# Patient Record
Sex: Female | Born: 1995 | Race: Black or African American | Hispanic: No | Marital: Single | Smoking: Current every day smoker
Health system: Southern US, Community
[De-identification: ages and names within clinical notes are randomized; demographics above are authoritative.]

---
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
## 2016-08-20 IMAGING — US US BREAST*L* LIMITED INC AXILLA
1 series · 13 of 13 positions shown · non-contrast
Comparison: None.

CLINICAL DATA: 19-year-old female with recent history of a left
nipple piercing that became infected. The patient presents with a
palpable left subareolar mass associated with pain and erythema.

EXAM:
ULTRASOUND OF THE LEFT BREAST

[Series 1: us breast*left* limited inc axilla · 0.10mm/px · 13 of 13 slices shown]
[im 1/13]
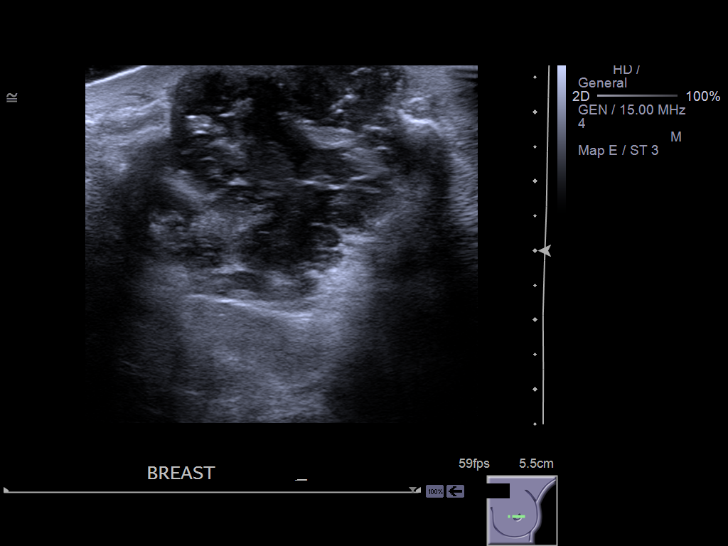
[im 2/13]
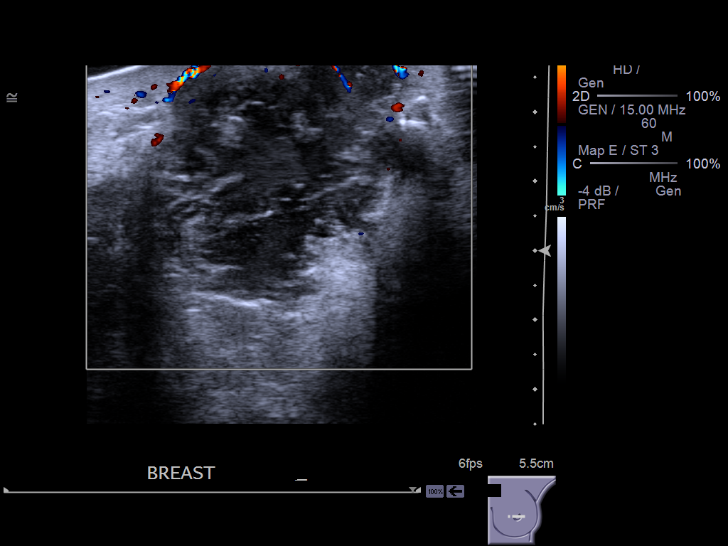
[im 3/13]
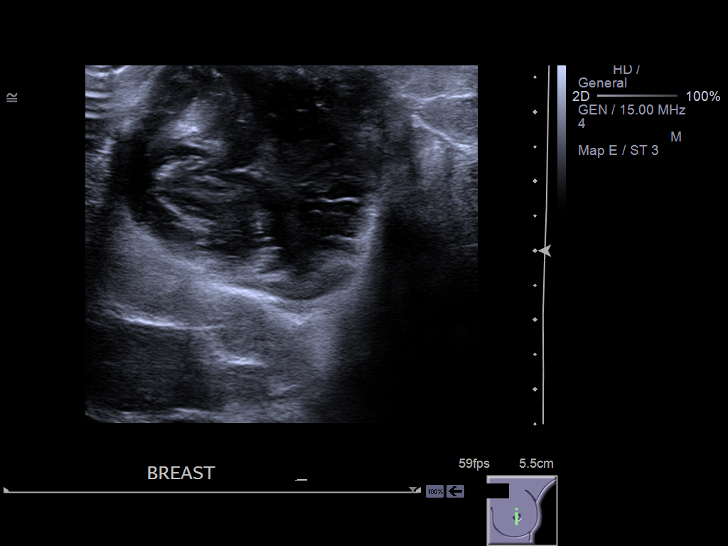
[im 4/13]
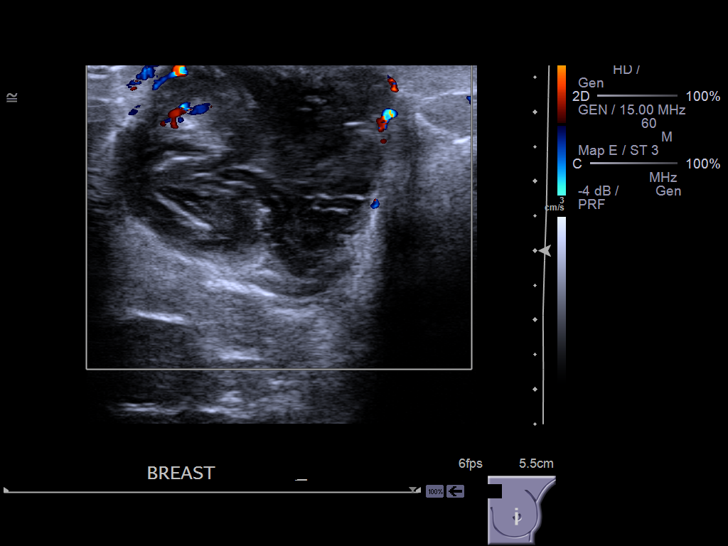
[im 5/13]
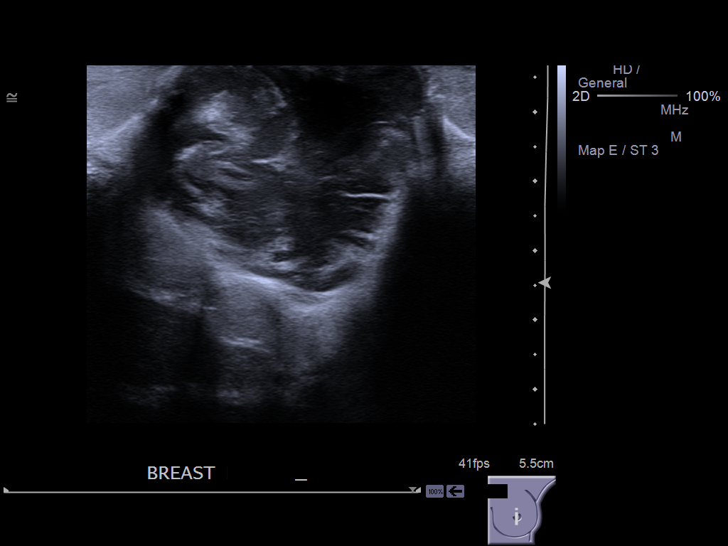
[im 6/13]
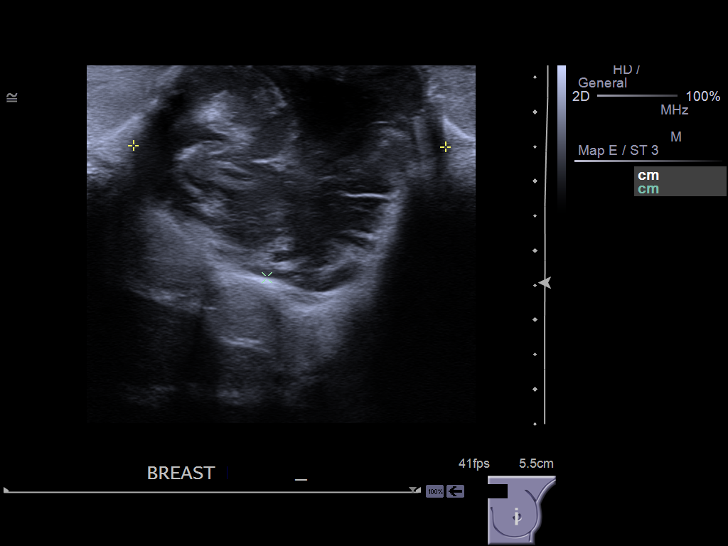
[im 7/13]
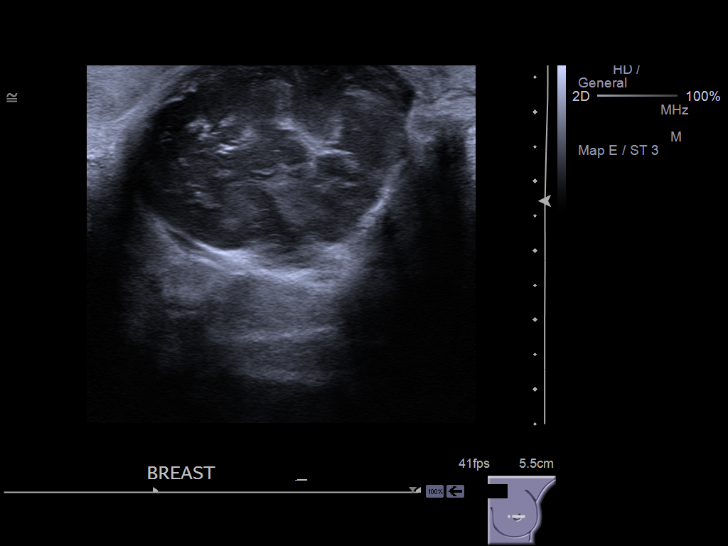
[im 8/13]
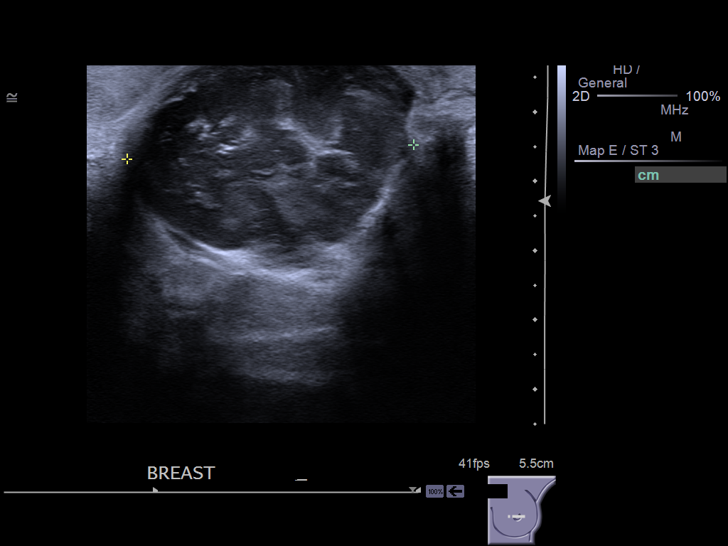
[im 9/13]
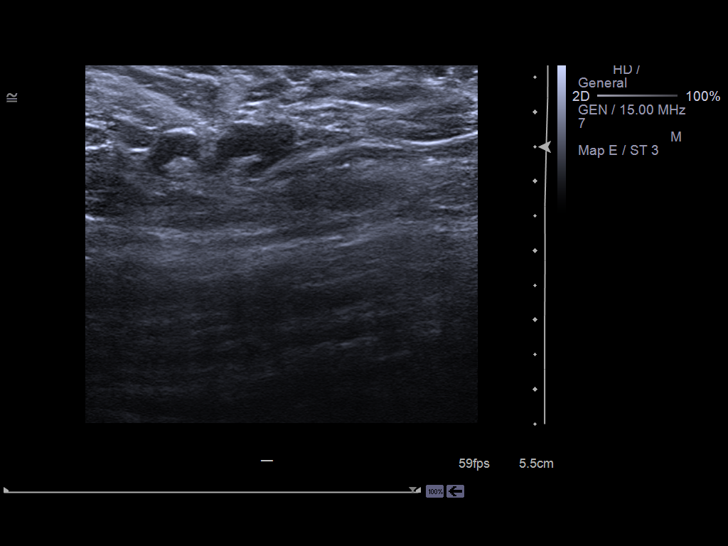
[im 10/13]
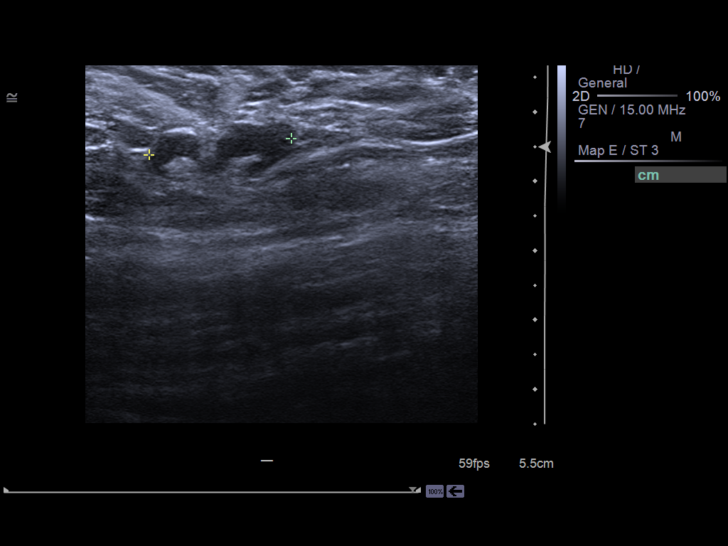
[im 11/13]
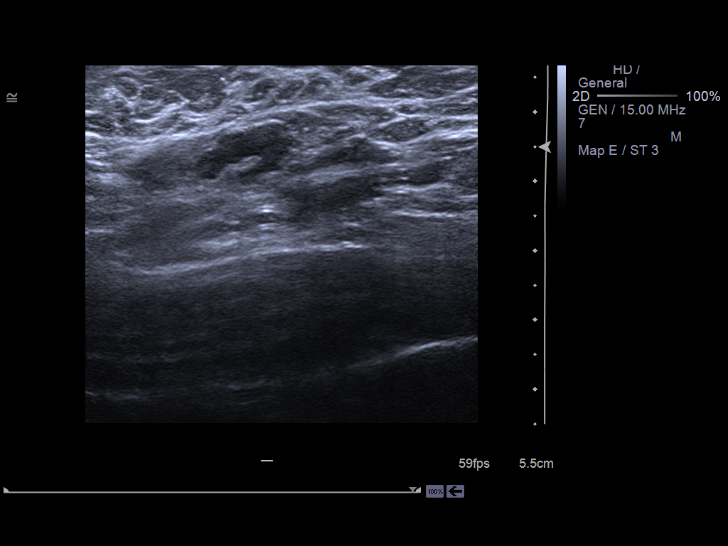
[im 12/13]
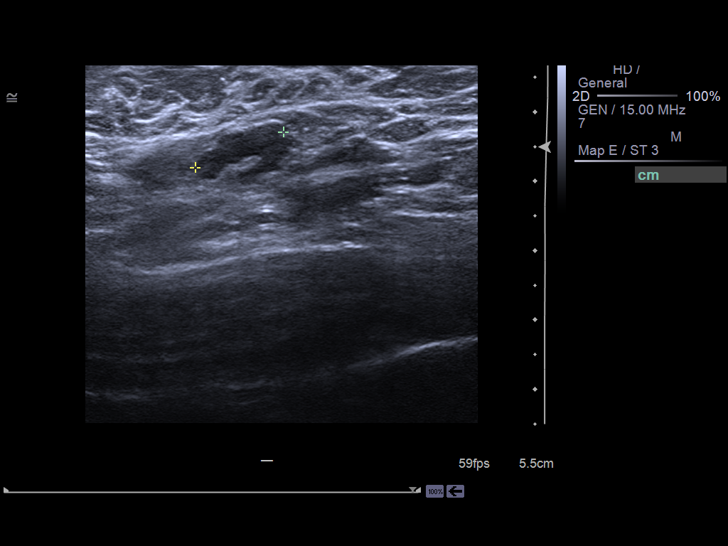
[im 13/13]
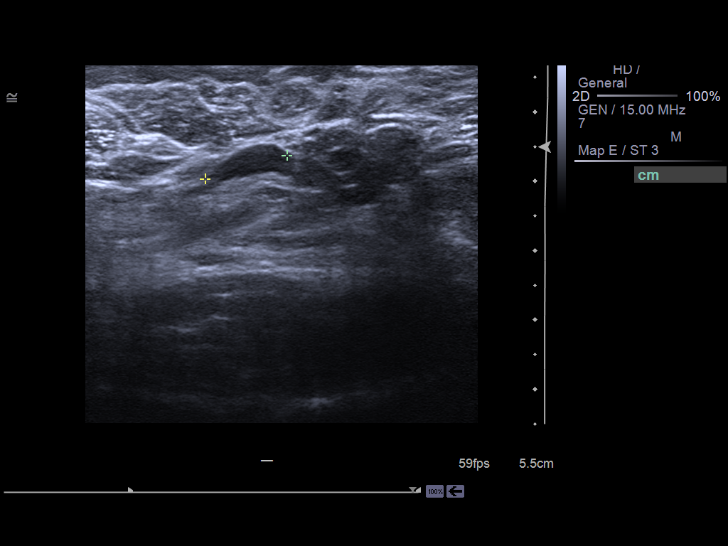

[13 of 13 positions shown; findings below may reference images not displayed]

FINDINGS: On physical exam, there is visible erythema surrounding the nipple
and areola associated with a palpable mass.

Targeted ultrasound is performed, showing a complex mixed echogenic
mass measuring 4.5 x 3.2 x 4.1 cm. Sonographic evaluation of the
left axilla shows 2 lymph nodes measuring 1.4 and 1.2 cm with
thickened cortices.
IMPRESSION: Probable left subareolar abscess and inflammatory axillary
adenopathy.

RECOMMENDATION:
Ultrasound-guided aspiration will be performed and dictated
separately. The fluid will be sent for culture and gram stain. The
patient's physician Dr. Seinde called the patient in a
prescription for Augmentin. She will have a follow-up clinical exam
and ultrasound in 2 weeks. She was told if the lesion became bigger
on antibiotics she should return for sooner followup.

I have discussed the findings and recommendations with the patient.
Results were also provided in writing at the conclusion of the
visit. If applicable, a reminder letter will be sent to the patient
regarding the next appointment.

BI-RADS CATEGORY  3: Probably benign finding(s) - short interval
follow-up suggested.

## 2016-08-20 IMAGING — US US BREAST CYST ASPIRATION 1ST CYST
1 series · 4 of 4 positions shown · non-contrast
Comparison: Previous exams.

CLINICAL DATA: 19-year-old female with recent history of a nipple
piercing that became infected. She presents with a left subareolar
mass associated with pain in erythema.

EXAM:
ULTRASOUND GUIDED LEFT BREAST CYST ASPIRATION

[Series 1: us breast cyst aspiration 1st cyst · 0.08mm/px · 4 of 4 slices shown]
[im 1/4]
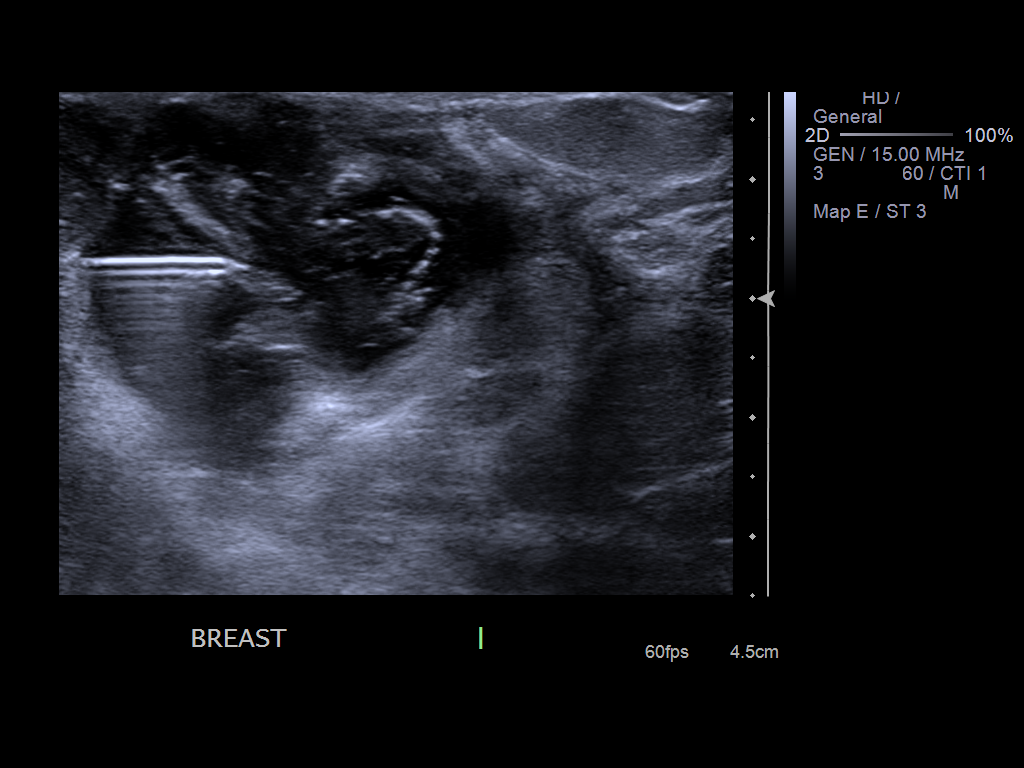
[im 2/4]
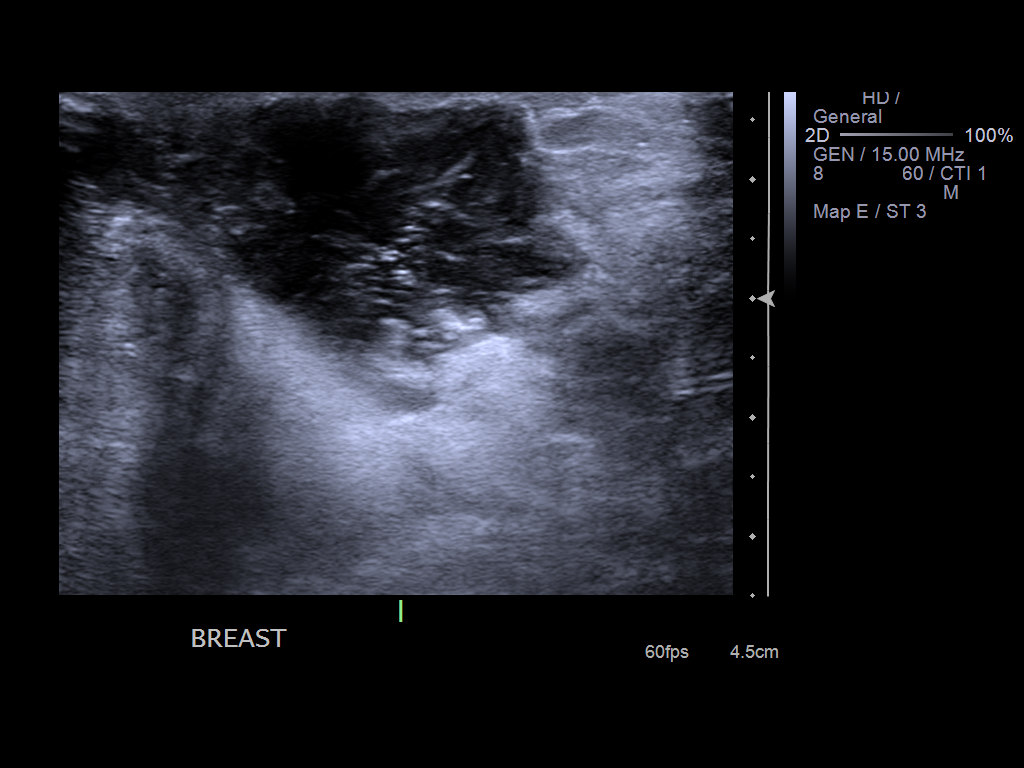
[im 3/4]
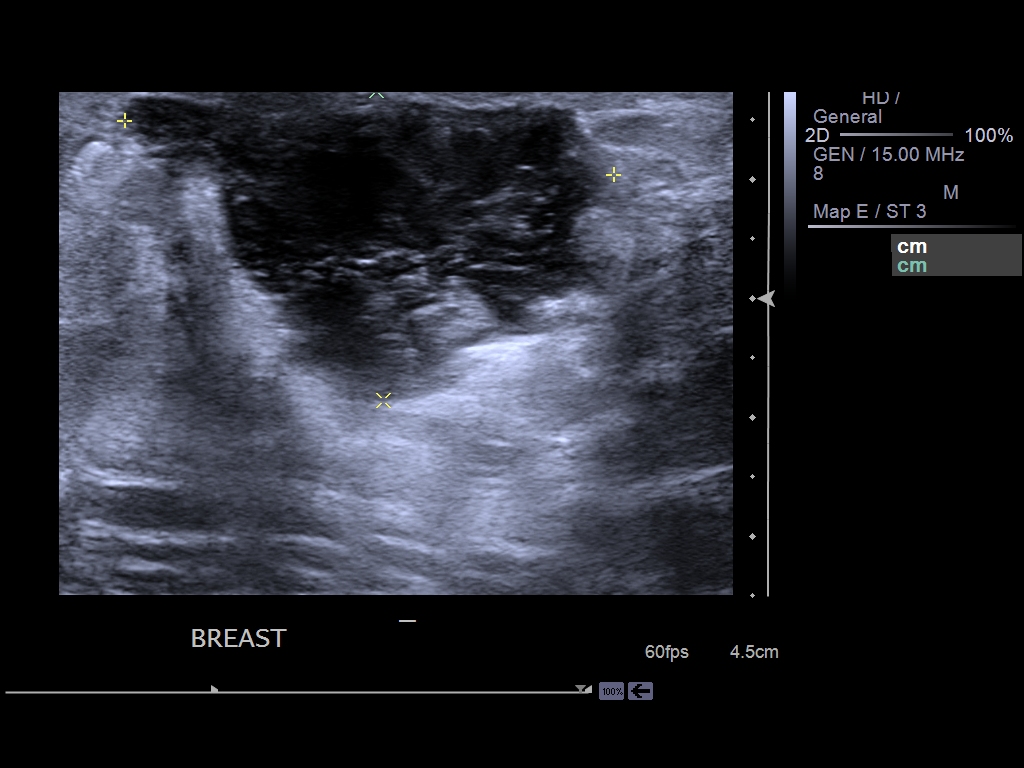
[im 4/4]
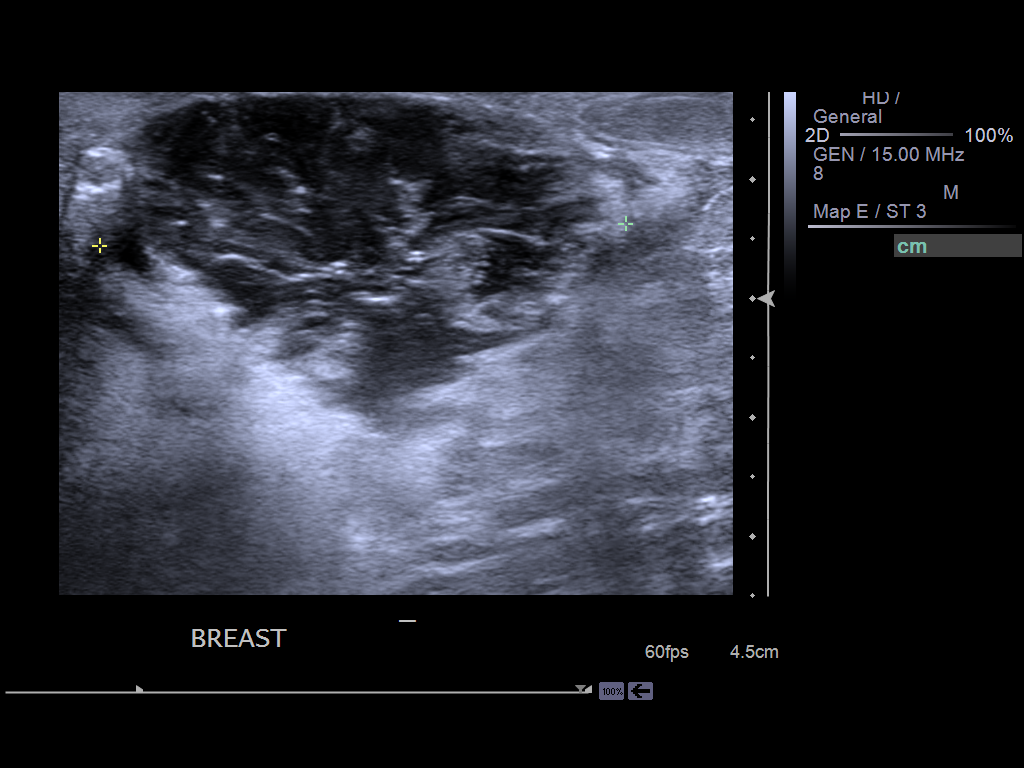

[4 of 4 positions shown; findings below may reference images not displayed]

PROCEDURE:
Using sterile technique, 2% lidocaine, under direct ultrasound
visualization, needle aspiration of the left subareolar mass was
performed. Approximately 10 cc of purulent brown fluid was
aspirated. Following the aspiration a hypoechoic mass measuring
x 2.6 x 4.4 cm persisted.
IMPRESSION: Ultrasound-guided aspiration of the left breast No apparent
complications.

RECOMMENDATIONS:
Probable left breast abscess. The fluid will be sent for culture and
gram stain. The patient's physician Dr. [REDACTED] the patient
on Augmentin. She will have a follow-up clinical exam and ultrasound
in 2 weeks. She was told if the lesion became bigger on antibiotics
she should return for sooner followup.

## 2017-06-10 ENCOUNTER — Encounter: Payer: Self-pay | Admitting: Emergency Medicine

## 2017-06-10 ENCOUNTER — Emergency Department
Admission: EM | Admit: 2017-06-10 | Discharge: 2017-06-10 | Disposition: A | Discharge status: Home / Self Care | Payer: Self-pay | Attending: Emergency Medicine | Admitting: Emergency Medicine

## 2017-06-10 ENCOUNTER — Other Ambulatory Visit: Payer: Self-pay

## 2017-06-10 DIAGNOSIS — F17200 Nicotine dependence, unspecified, uncomplicated: Secondary | ICD-10-CM | POA: Insufficient documentation

## 2017-06-10 DIAGNOSIS — Y9259 Other trade areas as the place of occurrence of the external cause: Secondary | ICD-10-CM | POA: Insufficient documentation

## 2017-06-10 DIAGNOSIS — S025XXA Fracture of tooth (traumatic), initial encounter for closed fracture: Secondary | ICD-10-CM | POA: Insufficient documentation

## 2017-06-10 DIAGNOSIS — Y999 Unspecified external cause status: Secondary | ICD-10-CM | POA: Insufficient documentation

## 2017-06-10 DIAGNOSIS — Y939 Activity, unspecified: Secondary | ICD-10-CM | POA: Insufficient documentation

## 2017-06-10 DIAGNOSIS — S0993XA Unspecified injury of face, initial encounter: Secondary | ICD-10-CM

## 2017-06-10 DIAGNOSIS — S01511A Laceration without foreign body of lip, initial encounter: Secondary | ICD-10-CM | POA: Insufficient documentation

## 2017-06-10 MED ORDER — HYDROCODONE-ACETAMINOPHEN 5-325 MG PO TABS
1.0000 | ORAL_TABLET | Freq: Once | ORAL | Status: AC
  Administered 2017-06-10: 1 via ORAL
  Filled 2017-06-10: qty 1

## 2017-06-10 MED ORDER — IBUPROFEN 800 MG PO TABS
800.0000 mg | ORAL_TABLET | Freq: Once | ORAL | Status: AC
  Administered 2017-06-10: 800 mg via ORAL
  Filled 2017-06-10: qty 1

## 2017-06-10 NOTE — ED Notes (Signed)
Reviewed discharge instructions, follow-up care, adhesive glue care, adhesive strip care, and laceration care with patient. Patient verbalized understanding of all information reviewed. Patient stable, with no distress noted at this time.
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
## 2017-06-10 NOTE — Discharge Instructions (Signed)
Glue will observe and table follow-up in approximately 5-7 days.  You may pull tape off if still attached at that time.  Follow-up with your dentist.  Return to the ER for worsening symptoms, persistent vomiting, lethargy or other concerns.
